# Patient Record
Sex: Female | Born: 1990 | Race: White | Hispanic: No | Marital: Single | Smoking: Former smoker
Health system: Southern US, Community
[De-identification: ages and names within clinical notes are randomized; demographics above are authoritative.]

## PROBLEM LIST (undated history)

## (undated) DIAGNOSIS — F419 Anxiety disorder, unspecified: Secondary | ICD-10-CM

## (undated) DIAGNOSIS — R569 Unspecified convulsions: Secondary | ICD-10-CM

## (undated) HISTORY — PX: NO PAST SURGERIES: SHX2092

## (undated) HISTORY — DX: Unspecified convulsions: R56.9

## (undated) HISTORY — DX: Anxiety disorder, unspecified: F41.9

---

## 2011-03-30 ENCOUNTER — Emergency Department: Payer: Self-pay | Admitting: Internal Medicine

## 2011-03-30 LAB — COMPREHENSIVE METABOLIC PANEL
Anion Gap: 9 (ref 7–16)
BUN: 10 mg/dL (ref 7–18)
Calcium, Total: 8.9 mg/dL (ref 8.5–10.1)
Chloride: 105 mmol/L (ref 98–107)
Co2: 28 mmol/L (ref 21–32)
Creatinine: 0.66 mg/dL (ref 0.60–1.30)
EGFR (African American): >60
EGFR (Non-African Amer.): >60
Potassium: 3.9 mmol/L (ref 3.5–5.1)
SGOT(AST): 88 U/L — ABNORMAL HIGH (ref 15–37)
SGPT (ALT): 32 U/L
Total Protein: 7.4 g/dL (ref 6.4–8.2)

## 2011-03-30 LAB — DRUG SCREEN, URINE
Amphetamines, Ur Screen: NEGATIVE (ref ?–1000)
Cannabinoid 50 Ng, Ur ~~LOC~~: POSITIVE (ref ?–50)
MDMA (Ecstasy)Ur Screen: NEGATIVE (ref ?–500)
Methadone, Ur Screen: NEGATIVE (ref ?–300)
Opiate, Ur Screen: NEGATIVE (ref ?–300)
Phencyclidine (PCP) Ur S: NEGATIVE (ref ?–25)

## 2011-03-30 LAB — CBC
HCT: 37.8 % (ref 35.0–47.0)
RBC: 3.87 10*6/uL (ref 3.80–5.20)
RDW: 13.1 % (ref 11.5–14.5)
WBC: 15.2 10*3/uL — ABNORMAL HIGH (ref 3.6–11.0)

## 2011-03-30 LAB — PREGNANCY, URINE: Pregnancy Test, Urine: NEGATIVE m[IU]/mL

## 2011-05-19 ENCOUNTER — Emergency Department: Payer: Self-pay | Admitting: Emergency Medicine

## 2011-05-19 LAB — URINALYSIS, COMPLETE
Blood: NEGATIVE
Ketone: NEGATIVE
Leukocyte Esterase: NEGATIVE
Ph: 7 (ref 4.5–8.0)
Protein: NEGATIVE
RBC,UR: <1 /HPF (ref 0–5)
Squamous Epithelial: 1
WBC UR: 2 /HPF (ref 0–5)

## 2011-05-19 LAB — COMPREHENSIVE METABOLIC PANEL
Albumin: 3.9 g/dL (ref 3.4–5.0)
BUN: 11 mg/dL (ref 7–18)
Bilirubin,Total: 0.4 mg/dL (ref 0.2–1.0)
Calcium, Total: 8.7 mg/dL (ref 8.5–10.1)
Creatinine: 0.77 mg/dL (ref 0.60–1.30)
Glucose: 91 mg/dL (ref 65–99)
Potassium: 3.8 mmol/L (ref 3.5–5.1)
SGOT(AST): 66 U/L — ABNORMAL HIGH (ref 15–37)
SGPT (ALT): 26 U/L
Total Protein: 7.1 g/dL (ref 6.4–8.2)

## 2011-05-19 LAB — CBC
HGB: 12.4 g/dL (ref 12.0–16.0)
MCH: 33.5 pg (ref 26.0–34.0)
MCV: 98 fL (ref 80–100)
Platelet: 259 10*3/uL (ref 150–440)
RBC: 3.72 10*6/uL — ABNORMAL LOW (ref 3.80–5.20)
WBC: 10.2 10*3/uL (ref 3.6–11.0)

## 2012-07-06 ENCOUNTER — Telehealth: Payer: Self-pay

## 2012-07-06 NOTE — Telephone Encounter (Signed)
Called to sched appt as requested. No answer.

## 2012-12-19 ENCOUNTER — Ambulatory Visit (INDEPENDENT_AMBULATORY_CARE_PROVIDER_SITE_OTHER): Payer: 59 | Admitting: Diagnostic Neuroimaging

## 2012-12-19 ENCOUNTER — Encounter: Payer: Self-pay | Admitting: Diagnostic Neuroimaging

## 2012-12-19 VITALS — BP 111/60 | HR 66 | Ht 67.5 in | Wt 133.0 lb

## 2012-12-19 DIAGNOSIS — G40909 Epilepsy, unspecified, not intractable, without status epilepticus: Secondary | ICD-10-CM | POA: Insufficient documentation

## 2012-12-19 DIAGNOSIS — G40109 Localization-related (focal) (partial) symptomatic epilepsy and epileptic syndromes with simple partial seizures, not intractable, without status epilepticus: Secondary | ICD-10-CM

## 2012-12-19 MED ORDER — LEVETIRACETAM 500 MG PO TABS: 500.0000 mg | ORAL_TABLET | Freq: Two times a day (BID) | ORAL | Status: DC
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
## 2012-12-19 NOTE — Patient Instructions (Signed)
Continue current medication.
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
## 2013-10-17 ENCOUNTER — Telehealth: Payer: Self-pay | Admitting: Diagnostic Neuroimaging

## 2013-10-17 NOTE — Telephone Encounter (Signed)
Return call to patient, number listed is no longer in service, contact number busy, will try again later for more information on seizure

## 2013-10-17 NOTE — Telephone Encounter (Signed)
Patient had seizure on Tuesday and would like a sooner appointment with Dr. Marjory LiesPenumalli.  Please call and advise.

## 2013-11-06 ENCOUNTER — Telehealth: Payer: Self-pay | Admitting: Diagnostic Neuroimaging

## 2013-11-06 NOTE — Telephone Encounter (Signed)
Patient calling to state that she had a seizure a few weeks ago and wants to get a sooner appointment to see Dr. Marjory LiesPenumalli than the one already scheduled. Please call and advise.

## 2013-11-10 NOTE — Telephone Encounter (Signed)
Spoke to patient. Had recent seizure. Requesting earlier appt. Scheduled appt for tomorrwo 11/11/13 with NP. Patient agreed.

## 2013-11-11 ENCOUNTER — Ambulatory Visit: Payer: Self-pay | Admitting: Nurse Practitioner

## 2013-11-11 ENCOUNTER — Telehealth: Payer: Self-pay | Admitting: Nurse Practitioner

## 2013-11-11 NOTE — Telephone Encounter (Signed)
Patient was no show for today's office appointment.  

## 2013-11-14 ENCOUNTER — Ambulatory Visit: Payer: 59 | Admitting: Nurse Practitioner

## 2013-11-18 ENCOUNTER — Telehealth: Payer: Self-pay | Admitting: Nurse Practitioner

## 2013-11-18 ENCOUNTER — Ambulatory Visit: Payer: Self-pay | Admitting: Nurse Practitioner

## 2013-11-18 NOTE — Telephone Encounter (Signed)
Patient was no show for today's office appointment.  

## 2013-12-12 ENCOUNTER — Ambulatory Visit (INDEPENDENT_AMBULATORY_CARE_PROVIDER_SITE_OTHER): Payer: 59 | Admitting: Nurse Practitioner

## 2013-12-12 ENCOUNTER — Encounter (INDEPENDENT_AMBULATORY_CARE_PROVIDER_SITE_OTHER): Payer: Self-pay

## 2013-12-12 ENCOUNTER — Encounter: Payer: Self-pay | Admitting: Nurse Practitioner

## 2013-12-12 VITALS — BP 115/64 | HR 67 | Ht 65.0 in | Wt 125.0 lb

## 2013-12-12 DIAGNOSIS — F419 Anxiety disorder, unspecified: Secondary | ICD-10-CM | POA: Insufficient documentation

## 2013-12-12 DIAGNOSIS — G40909 Epilepsy, unspecified, not intractable, without status epilepticus: Secondary | ICD-10-CM

## 2013-12-12 DIAGNOSIS — G40109 Localization-related (focal) (partial) symptomatic epilepsy and epileptic syndromes with simple partial seizures, not intractable, without status epilepticus: Secondary | ICD-10-CM

## 2013-12-12 MED ORDER — LEVETIRACETAM 500 MG PO TABS: 500.0000 mg | ORAL_TABLET | Freq: Two times a day (BID) | ORAL | Status: DC
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
## 2013-12-12 NOTE — Patient Instructions (Signed)
Continue Levetiracetam  twice daily.    Seizure First Aid: In most circumstances, seizures typically last less than 2-3 minutes, and do not require any intervention, besides keeping the patient safe from injury. Nothing should be placed in the patient's mouth. During the seizure, the patient should be rolled onto their side in hopes of preventing aspiration should vomiting occur. Do not place anything in the patient's mouth. Should the seizure persist greater than 5 minutes, intervention will likely be needed to get the seizure to stop. At the 5 minute mark, EMS should be called. Also, consider calling EMS for multiple seizures, injuries or if the patient is not improving towards baseline after a seizure.  Discussed state regulations regarding driving restrictions and the need to be free of seizures (that would impair the ability to operate a vehicle safely) for at least six months before returning to driving. The patient is aware that it is their responsibility to notify the [facility] and to not drive until approved.  .Managing Seizure Triggers: Tips for Lifestyle Modification Adapted from the [facility], [facility], [place], [place] and the journal "Clinical Nursing Practice in Epilepsy", Spring 1994.  Developing plans to modify your lifestyle is an important part of seizure preparedness. It's a way that you, as a person with seizures or a parent of a child with seizures, can take charge and play an active role in your epilepsy care. The following tips are examples of what people can do to manage triggers. Some of these tips may require a change in behavior, others may be ways to adjust your environment or schedule so not everything happens at once. Before choosing tips to try, make sure you've assessed your situation and talked to your doctor and other health care professionals for their suggestions too. Please note that research on the effectiveness  of many of these techniques is limited. Many of these tips are common sense suggestions or are from health care professionals and people with epilepsy as to what they have seen and tried.  Noises: People who think they are affected by noises should be sure to talk to their doctor about whether they have a form of 'reflex epilepsy' or if general noise or distraction may be a trigger in another way. People with true reflex epilepsy may respond to specific seizure medicines and should talk to their doctor. Try using earplugs or earphones, especially in noisy or crowded places. Try listening to relaxing music or sounds, or try distracting yourself by singing or focusing on another activity.  Bright, flashing or fluorescent lights: Use polarized or tinted glasses. Use natural lighting when indoors. Focus on distant objects when riding in a car to avoid flickering lights or patterns. Avoid discos, strobe lights or flashing bulbs on holiday decorations. Use computer monitor with minimal contrast glare or use a screen filter. Consult with your doctor about other specific recommendations for computer use.  Sleep: Try to regulate sleeping habits so you have a consistent schedule and get enough sleep. Keep a log or diary of your sleep patterns, seizures and general well-being. Ask a partner or companion to record his or her observations too. Consider the following ideas to improve sleep.  . Discuss your medicine schedule with your doctor or nurse. Changing times or doses at night may help sleep. . Limit caffeine and try to avoid it after noon time or mid?afternoon at the latest. . Avoid alcohol and nicotine prior to sleep. . Limit working or studying late at  night. Stop work at least one hour before bedtime to allow time to relax. . Exercise in the early evening if possible. . Take warm showers or have someone give you a back rub before bedtime to decrease muscle tension. . Try relaxation  exercises before bedtime. . Limit naps and don't nap in the early evening. . If anxious or worried, talk to someone or write down your feelings before going to sleep. Put this away and deal with these worries or concerns in the morning! . If you can't fall asleep within 15 minutes get up and do something else for 15 minutes. Then go back to bed and try again. Don't toss and turn in bed all night.  Exercise: Regular exercise is good for everyone. Pace your exercise to avoid getting too tired or hyperventilation. Avoid exercising in the middle of the day during hot weather. Ask your doctor about any specific exercises you may need to avoid.  Hyperventilation: Try relaxation or slow breathing exercises when anxious or if you begin to hyperventilate. Pace your activity and avoid sports that may trigger hyperventilation.  Diet: Regulate meal times and patterns around sleep, activity, and medication schedules. Usually taking medicines after food or around meals makes it easier to remember them and may lessen any stomach distress from side effects of medicines. Have a well-balanced diet and eat at consistent times to avoid long periods without food. If your appetite is poor, try small frequent meals instead of skipping meals. Avoid foods and drinks that may aggravate seizures. Not everyone is sensitive to foods, but if you are, talk to your doctor about how to modify your diet. If you are following a diet specifically for your epilepsy, be sure to follow the advice of your doctor and nutritionist.  Alcohol/Drugs: Avoid recreational drugs and talk to your doctor about use of alcohol. Avoid alcohol completely if you're going through high-risk times or have recently had surgery. If you choose to drink alcohol, use 'moderation', drink slowly, and have only one or two glasses at a time. Consider carefully what you drink, avoiding 'hard liquor' or mixed drinks that may have high alcohol content. If alcohol  and drugs are a problem for you, talk to your doctor and get professional help.  Hormonal changes: Both men and women may notice a cyclical pattern to their seizures. Record seizures on a calendar and track them in relation to any changes in hormones. Women who are having menstrual cycles should track their cycle days. Women who have stopped having their menses should track other symptoms or changes, while women who are pregnant should track their pregnancy too. The use of hormonal medicines, such as contraceptives or birth control pills as well as hormonal replacement therapy, may affect seizures in some women, so record the dates and doses of these medicines.  NOTE: some seizure medicines may interfere with the effectiveness of hormonal contraceptives making unexpected or unplanned pregnancy more likely. Be sure to talk to your doctor about all contraceptive use.When seizures cluster around menses or hormone changes, women should try to modify their lifestyle so other triggers don't occur during this high-risk time. Some women may use 'as needed' medicines to help treat seizures associated with menses. Note the use of these on your calendars and seizure preparedness plan.  Illness, fever, trauma: Notify your doctor if you become ill, have a fever, injure yourself seriously, or need other medicines such as antibiotics, painkillers, or cold medicines. Some people may notice that certain medicines can trigger  seizures or interfere with seizure medicines. Fevers, other illnesses and injuries may also make you more susceptible and you'll need to monitor your seizures carefully. Try to limit other triggers during these times and talk to your doctor about what medicines you can use.  Stress, anxiety, depression: Emotional stress is a common trigger for some people, and stress can be a cause and symptom of mood problems such as anxiety and depression. Track your stress level and mood in relation to your  seizures on your diary. During stressful times, consider ways to modify your lifestyle and manage stress better. . Try counseling to help cope with seizures or other problems. . Consider support groups for epilepsy, or groups for stress management, therapy, and other support. . Write down feelings in a diary on a regular basis. It helps you get feelings out, rather than hold them in, and can help you see the issues more clearly. . Use 'time-out' periods. Just like kids may need a time-out when they are overwhelmed or acting out, so too do adults. Giving yourself a time-out allows you to take a step back from the stressor or situation and think about how best to address it. . Learn relaxation exercises, deep breathing, yoga, or other strategies that help with stress and general well-being. . Tell your doctor and nurse how you feel. The effects of stress can be harmful to your seizures, and your life. When mood changes last longer than expected, you may need help from a mental health professional too. If you feel emotionally unsafe, call your doctor or go to an emergency room to be evaluated.

## 2013-12-18 NOTE — Progress Notes (Signed)
I reviewed note and agree with plan.   Ankush Gintz R. Ruthella Kirchman, MD  Certified in Neurology, Neurophysiology and Neuroimaging  [facility] [address], [address] [place], [place] [address] [phone]   

## 2013-12-19 ENCOUNTER — Ambulatory Visit: Payer: 59 | Admitting: Diagnostic Neuroimaging

## 2014-08-04 LAB — HM PAP SMEAR: HM Pap smear: NEGATIVE

## 2014-12-14 ENCOUNTER — Ambulatory Visit: Payer: 59 | Admitting: Diagnostic Neuroimaging

## 2014-12-15 ENCOUNTER — Encounter: Payer: Self-pay | Admitting: Diagnostic Neuroimaging

## 2014-12-21 ENCOUNTER — Telehealth: Payer: Self-pay | Admitting: Diagnostic Neuroimaging

## 2014-12-21 DIAGNOSIS — G40109 Localization-related (focal) (partial) symptomatic epilepsy and epileptic syndromes with simple partial seizures, not intractable, without status epilepticus: Secondary | ICD-10-CM

## 2014-12-21 MED ORDER — LEVETIRACETAM 500 MG PO TABS: 500.0000 mg | ORAL_TABLET | Freq: Two times a day (BID) | ORAL | Status: DC

## 2014-12-21 NOTE — Telephone Encounter (Signed)
Patient phone call [phone] (last seen 12/19/12, no show 12/14/14, pt cx 12/19/13) requesting refill on levETIRAcetam (KEPPRA) 500 MG tablet. Pt states she had called one time before and nurse was able to get her a refill on this medication.

## 2014-12-21 NOTE — Telephone Encounter (Signed)
Called patient and informed her that Dr Marjory Lies approved 1 month refill, and it has been sent to her pharmacy. Informed her she must come in for FU for further refills. She states she did not come for her FU earlier this month because she no longer has insurance. She states she left vm for Angie Atkins, billing to call her and let her know what her cost will be for a FU. Informed her that Karoline Caldwell is out today but returns tomorrow and will call her back. She verbalized understanding and stated she would call back after speaking with Angie to schedule a FU.

## 2014-12-21 NOTE — Telephone Encounter (Signed)
Refill 1 month and setup revisit with me. -VRP

## 2014-12-24 NOTE — Telephone Encounter (Signed)
Spoke with patient who spoke with Angie and Gaynell in billing. She will now reschedule a FU to get medication refill order. Scheduled her for 01/19/15, requested she arrive 15 min early to check in. She verbalized understanding.

## 2015-01-19 ENCOUNTER — Telehealth: Payer: Self-pay | Admitting: Diagnostic Neuroimaging

## 2015-01-19 ENCOUNTER — Ambulatory Visit: Payer: Self-pay | Admitting: Diagnostic Neuroimaging

## 2015-01-19 DIAGNOSIS — G40109 Localization-related (focal) (partial) symptomatic epilepsy and epileptic syndromes with simple partial seizures, not intractable, without status epilepticus: Secondary | ICD-10-CM

## 2015-01-19 NOTE — Telephone Encounter (Signed)
Per Dr Marjory LiesPenumalli, called pt's pharmacy,  [facility] [place] and spoke with Jesusita Okaan, pharmacist; gave orders for one month refill of Keppra 500 mg tabs, disp #60, 0 refills. Advised him patient is being referred to another neurology practice and may not refill until she establishes care there. He verbalized understanding.

## 2015-01-19 NOTE — Telephone Encounter (Signed)
Spoke with patient who is requesting referral to [facility], closer to her home.  [facility] will prescribe another month of her Keppra; she confirmed pharmacy.  Informed her that Dr Marjory LiesPenumalli will prescribe medication until she is seen by [facility] if her referral apt is not within the next month. She verbalized understanding, appreciation.  Referral placed in Epic for ambulatory neurology, [facility].

## 2015-01-19 NOTE — Telephone Encounter (Signed)
Pt called and is canelling her appt for today. She is requesting a refill on levETIRAcetam (KEPPRA) 500 MG tablet. She does not need it just yet but knows that she will before she can come back for an appt.  Please Note: Pt has had 3 NO SHOWS and today will make her 4th. May call pt [phone].

## 2015-01-19 NOTE — Telephone Encounter (Addendum)
Left VM requesting patient call back to discuss importance of her coming to her apt today. Per previous phone conversation she was informed that Dr Marjory LiesPenumalli needed to see her for FU because she was last seen over one year ago. He only approved a refill x 1 month with stipulation, understanding she see him for FU before more medication would be prescribed. Left this caller's name, number.   1:55 pm  Left second VM requesting call back to discuss medication and FU.

## 2015-01-22 ENCOUNTER — Telehealth: Payer: Self-pay | Admitting: *Deleted

## 2015-01-22 ENCOUNTER — Encounter: Payer: Self-pay | Admitting: *Deleted

## 2015-01-22 NOTE — Telephone Encounter (Signed)
Called patient and informed her that another month's refill of Kepp[facility] has [facility] [place], informed her this RN spoke [facility]end[facility]. She confirmed her appointment with [facility] and verbalized understanding, appreciation of call.

## 2015-03-01 ENCOUNTER — Telehealth: Payer: Self-pay | Admitting: Diagnostic Neuroimaging

## 2015-03-01 DIAGNOSIS — G40109 Localization-related (focal) (partial) symptomatic epilepsy and epileptic syndromes with simple partial seizures, not intractable, without status epilepticus: Secondary | ICD-10-CM

## 2015-03-01 NOTE — Telephone Encounter (Signed)
Patient called to advise she has new patient appointment at [facility] this month, however would like to be referred to [facility], she understands they have a program for people like her that has no insurance. It would be better off for her financial situation to [facility].

## 2015-03-09 NOTE — Telephone Encounter (Signed)
Rn call patient about her referral to [facility]. Pt stated she lives in [place] and works two jobs and is in school. Rn stated the referral was sent by the referral staff at [facility]. The order stated they were book until  April 2017. Rn gave patient number to call to schedule appointment. Pt stated she has seizure medications until end of December 2016. Pt stated she has not had any seizures and is doing fine with the current medication regimen. Rn stated she can be schedule with a NP for refills in the next two weeks. Pt stated she has to give her job a two weeks notice for time off. Pt would like refills until she gets an appt in at [facility]. Pt was last seen 12/2013 by a [facility] provider. Rn stated Dr. Marjory LiesPenumalli will be notified of her wanting refills for a month or 3 months whatever he can decide. Pt will try to get an appt with the next month.

## 2015-03-09 NOTE — Telephone Encounter (Signed)
Patient is calling and states she has not heard anything in regard to an referral/appointment at [facility].  Please advise.  Thanks!

## 2015-03-10 ENCOUNTER — Other Ambulatory Visit: Payer: Self-pay

## 2015-03-10 DIAGNOSIS — G40109 Localization-related (focal) (partial) symptomatic epilepsy and epileptic syndromes with simple partial seizures, not intractable, without status epilepticus: Secondary | ICD-10-CM

## 2015-03-10 MED ORDER — LEVETIRACETAM 500 MG PO TABS: 500.0000 mg | ORAL_TABLET | Freq: Two times a day (BID) | ORAL | Status: AC

## 2015-03-10 NOTE — Telephone Encounter (Signed)
pls refill her meds until her appt with new neurologist in Apr/May 2017

## 2015-03-10 NOTE — Telephone Encounter (Signed)
Rn call patient about her refill for Keppra. Rn stated Dr. Marjory LiesPenumalli stated she can refills till April or May 2017. Refill done for keppra. Pt will be [facility] for an appt for April or May 2017.

## 2015-06-23 NOTE — Telephone Encounter (Signed)
Error

## 2015-07-17 ENCOUNTER — Other Ambulatory Visit: Payer: Self-pay | Admitting: Diagnostic Neuroimaging

## 2015-12-11 ENCOUNTER — Encounter: Payer: Self-pay | Admitting: *Deleted

## 2015-12-11 ENCOUNTER — Emergency Department
Admission: EM | Admit: 2015-12-11 | Discharge: 2015-12-11 | Disposition: A | Discharge status: Home / Self Care | Payer: Self-pay | Attending: Emergency Medicine | Admitting: Emergency Medicine

## 2015-12-11 DIAGNOSIS — R21 Rash and other nonspecific skin eruption: Secondary | ICD-10-CM | POA: Insufficient documentation

## 2015-12-11 DIAGNOSIS — T7840XA Allergy, unspecified, initial encounter: Secondary | ICD-10-CM

## 2015-12-11 MED ORDER — PREDNISONE 20 MG PO TABS
60.0000 mg | ORAL_TABLET | Freq: Once | ORAL | Status: AC
  Administered 2015-12-11: 60 mg via ORAL
  Filled 2015-12-11: qty 3

## 2015-12-11 MED ORDER — PREDNISONE 20 MG PO TABS: 40.0000 mg | ORAL_TABLET | Freq: Every day | ORAL | 0 refills | Status: DC

## 2015-12-11 NOTE — ED Notes (Signed)
Pt discharged to home.  Discharge instructions reviewed.  Verbalized understanding.  No questions or concerns at this time.  Teach back verified.  Pt in NAD.  No items left in ED.   

## 2015-12-11 NOTE — ED Provider Notes (Signed)
Time Seen: Approximately [phone]  I have reviewed the triage notes  Chief Complaint: Allergic Reaction   History of Present Illness: Audrey Perez is a 25 y.o. female who states she was recently treated for kidney stones [facility] where she received some IV Toradol. Patient states she woke up in the morning with a rash that seems to be increasing his first across her torso and then spread to both her back and her arms. She states she had some feelings of throat tightness at home was still able to breathe and swallow normally. She states that part has seemed to improved after she took over-the-counter Benadryl prior to arrival. Any other new exposures and took ibuprofen for pain from her kidney stone but states that she's had ibuprofen the past without difficulty. An ice any new detergents, pets, etc.   Past Medical History:  Diagnosis Date  . Seizures [facility])     Patient Active Problem List   Diagnosis Date Noted  . Anxiety 12/12/2013  . Seizure disorder (HCC) 12/19/2012  . 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 No pertinent surgical history.  History reviewed. No pertinent surgical history.  Current Outpatient Rx  . Order #: [id number] Class: Historical Med  . Order #: [id number] Class: Normal  . Order #: [id number] Class: Historical Med  . Order #: [id number] Class: Print    Allergies:  Review of patient's allergies indicates no known allergies.  Family History: History reviewed. No pertinent family history.  Social History: Social History  Substance Use Topics  . Smoking status: Never Smoker  . Smokeless tobacco: Never Used  . Alcohol use Yes     Comment: occasionally     Review of Systems:   10 point review of systems was performed and was otherwise negative:  Constitutional: No fever Eyes: No visual disturbances ENT: No sore throat, ear pain Cardiac: No chest pain Respiratory: No shortness of breath, wheezing, or stridor Abdomen: No Current  abdominal pain, no vomiting, No diarrhea Endocrine: No weight loss, No night sweats Extremities: No peripheral edema, cyanosis Skin: No rashes, easy bruising Neurologic: No focal weakness, trouble with speech or swollowing Urologic: No dysuria, Hematuria, or urinary frequency   Physical Exam:  ED Triage Vitals  Enc Vitals Group     BP 12/11/15 0542 (!) 108/59     Pulse Rate 12/11/15 0542 74     Resp 12/11/15 0542 18     Temp 12/11/15 0542 98.4 F (36.9 C)     Temp Source 12/11/15 0542 Oral     SpO2 12/11/15 0542 100 %     Weight 12/11/15 0543 125 lb (56.7 kg)     Height 12/11/15 0543 5\' 5"  (1.651 m)     Head Circumference --      Peak Flow --      Pain Score --      Pain Loc --      Pain Edu? --      Excl. in GC? --     General: Awake , Alert , and Oriented times 3; GCS 15 Head: Normal cephalic , atraumatic Eyes: Pupils equal , round, reactive to light Nose/Throat: No nasal drainage, patent upper airway without erythema or exudate.  Neck: Supple, Full range of motion, No anterior adenopathy or palpable thyroid masses. No stridor Lungs: Clear to ascultation without wheezes , rhonchi, or rales Heart: Regular rate, regular rhythm without murmurs , gallops , or rubs Abdomen: Soft, non tender without rebound, guarding , or rigidity; bowel sounds positive  and symmetric in all 4 quadrants. No organomegaly .        Extremities: 2 plus symmetric pulses. No edema, clubbing or cyanosis Neurologic: normal ambulation, Motor symmetric without deficits, sensory intact Skin: Diffuse punctate erythematous raised pruritic rash across both her arms upper back anterior abdominal region appears hive-like.  ED Course:  Patient's stay here was uneventful and I couldn't give her any more Benadryl because of her driving here to the emergency department. She was prescribed and started on prednisone here in emergency department. She was advised that she may try to avoid ibuprofen as this may be the  source of her rash at this point take Tylenol for pain. She states she is currently not having any kidney stone discomfort. No evidence of anaphylactic reaction Clinical Course     Assessment: * Acute allergic reaction   Final Clinical Impression: *  Final diagnoses:  Allergic reaction, initial encounter     Plan: * Outpatient " New Prescriptions   PREDNISONE (DELTASONE) 20 MG TABLET    Take 2 tablets (40 mg total) by mouth daily.  " Patient was advised to return immediately if condition worsens. Patient was advised to follow up with their primary care physician or other specialized physicians involved in their outpatient care. The patient and/or family member/power of attorney had laboratory results reviewed at the bedside. All questions and concerns were addressed and appropriate discharge instructions were distributed by the nursing staff.             Jennye MoccasinBrian S Rodriquez Thorner, MD 12/11/15 [phone]

## 2015-12-11 NOTE — ED Triage Notes (Signed)
Pt states on Thursday evening she was treated for [facility] at [facility]. Pt states she woke with a rash on torso that has spread to back and arms. Pt states the only med she was given that she has nor received before was Toradol IV. Pt c/o throat feels tight and she felt as if it was hard to breathe. Pt took Benadryl OTC 50 mg PO prior to arrival. Pt states decreased itching but her throat still feels tight.

## 2015-12-11 NOTE — Discharge Instructions (Signed)
Please take Benadryl around-the-clock 50 mg every 6 hours. Return emergency Department for persistent chest discomfort, trouble with speech, trouble swallowing, or any other new concerns. Continue investigation at home for new exposures. Please contact her primary physician for possible referral to dermatology as needed  Please return immediately if condition worsens. Please contact her primary physician or the physician you were given for referral. If you have any specialist physicians involved in her treatment and plan please also contact them. Thank you for using [facility].

## 2016-03-02 ENCOUNTER — Ambulatory Visit: Payer: Self-pay | Admitting: Family Medicine

## 2016-03-10 ENCOUNTER — Ambulatory Visit: Payer: Self-pay | Admitting: Family Medicine

## 2016-05-26 ENCOUNTER — Ambulatory Visit
Admission: EM | Admit: 2016-05-26 | Discharge: 2016-05-26 | Disposition: A | Discharge status: Home / Self Care | Payer: Self-pay | Attending: Family Medicine | Admitting: Family Medicine

## 2016-05-26 ENCOUNTER — Encounter: Payer: Self-pay | Admitting: Emergency Medicine

## 2016-05-26 DIAGNOSIS — L600 Ingrowing nail: Secondary | ICD-10-CM

## 2016-05-26 MED ORDER — MUPIROCIN 2 % EX OINT: 1.0000 "application " | TOPICAL_OINTMENT | Freq: Three times a day (TID) | CUTANEOUS | 0 refills | Status: DC

## 2016-05-26 NOTE — ED Triage Notes (Signed)
Patient c/o pain in her right 1st toe off and on for a year. Patient also reports redness and swelling in her toe.

## 2016-05-26 NOTE — ED Provider Notes (Signed)
CSN: [id number]     Arrival date & time 05/26/16  1336 History   First MD Initiated Contact with Patient 05/26/16 1422     Chief Complaint  Patient presents with  . Toe Pain   (Consider location/radiation/quality/duration/timing/severity/associated sxs/prior Treatment) HPI  A 26 year old female who presents with aspect of her right first toe cleaning of the distal nail fold medially is been happening off and on for over a year. He said it does not seem to want to grow out he continually tries to cut back and trim it. She is probably had a toenail at one point it is she has never allowed to grow out. Clipping it very closely. All of her nails are trimmed improperly short and rounded. Currently does not have an infection.       Past Medical History:  Diagnosis Date  . Anxiety   . Seizures (HCC)    History reviewed. No pertinent surgical history. Family History  Problem Relation Age of Onset  . Anxiety disorder Father   . Hypertension Father   . Stroke Paternal Grandmother    Social History  Substance Use Topics  . Smoking status: Former Games developer  . Smokeless tobacco: Never Used  . Alcohol use Yes     Comment: occasionally   OB History    No data available     Review of Systems  Constitutional: Negative for activity change, chills, fatigue and fever.  Skin: Positive for wound.  All other systems reviewed and are negative.   Allergies  Patient has no known allergies.  Home Medications   Prior to Admission medications   Medication Sig Start Date End Date Taking? Authorizing Provider  levETIRAcetam (KEPPRA) 500 MG tablet Take 1 tablet (500 mg total) by mouth 2 (two) times daily. 03/10/15   Suanne Marker, MD  mupirocin ointment (BACTROBAN) 2 % Apply 1 application topically 3 (three) times daily. 05/26/16   Lutricia Feil, PA-C  norgestimate-ethinyl estradiol (ORTHO-CYCLEN,SPRINTEC,PREVIFEM) 0.25-35 MG-MCG tablet Take by mouth.    Historical Provider, MD   Meds  Ordered and Administered this Visit  Medications - No data to display  BP 121/69 (BP Location: Left Arm)   Pulse 77   Temp 98.3 F (36.8 C) (Oral)   Resp 16   Ht 5\' 6"  (1.676 m)   Wt 125 lb (56.7 kg)   LMP 05/05/2016 (Approximate)   SpO2 100%   BMI 20.18 kg/m  No data found.   Physical Exam  Constitutional: She is oriented to person, place, and time. She appears well-developed and well-nourished. No distress.  HENT:  Head: Normocephalic.  Eyes: Pupils are equal, round, and reactive to light.  Neck: Normal range of motion.  Musculoskeletal: Normal range of motion. She exhibits tenderness.  Examination of her right great toe shows no active infection present. Not tender in the area. The distal nail fold shows the patient to have trended into the remainder of the nail is trimmed very shortly and curved on the instructions well is trimmed all the way back to the nailbed. Is no drainage. Is no erythema. Is no swelling.  Neurological: She is alert and oriented to person, place, and time.  Skin: Skin is warm and dry. She is not diaphoretic.  Psychiatric: She has a normal mood and affect. Her behavior is normal. Judgment and thought content normal.  Nursing note and vitals reviewed.   Urgent Care Course     Procedures (including critical care time)  Labs Review Labs Reviewed - No  data to display  Imaging Review No results found.   Visual Acuity Review  Right Eye Distance:   Left Eye Distance:   Bilateral Distance:    Right Eye Near:   Left Eye Near:    Bilateral Near:         MDM   1. Ingrown right greater toenail    I had a long discussion with the patient about proper nail trimming. At this point it appears she is being too aggressive with her nail trimming and the trimming of the the medial portion of her toenail down to the nailbed. Nail fold is open on the distal end from her trimming. Luckily she has no infection present. Is apparent that she has had  infections in the past. Advised her to allow her nail to grow out. She can use a cotton wad under the nail or use  dental floss to elevate the edge of the nail to allow it to  grow out properly. If She is unable to do this then referral to podiatrist may be her best option.      Lutricia FeilWilliam P Carlisle Enke, PA-C 05/26/16 2214

## 2017-07-10 LAB — HM HIV SCREENING LAB: HM HIV Screening: NEGATIVE

## 2019-07-15 ENCOUNTER — Ambulatory Visit: Payer: Self-pay

## 2019-07-29 ENCOUNTER — Ambulatory Visit: Payer: Self-pay

## 2020-04-10 ENCOUNTER — Other Ambulatory Visit: Payer: Self-pay

## 2020-04-14 ENCOUNTER — Other Ambulatory Visit: Payer: Self-pay

## 2020-04-14 DIAGNOSIS — Z20822 Contact with and (suspected) exposure to covid-19: Secondary | ICD-10-CM

## 2020-04-16 LAB — NOVEL CORONAVIRUS, NAA: SARS-CoV-2, NAA: DETECTED — AB

## 2020-04-16 LAB — SARS-COV-2, NAA 2 DAY TAT

## 2020-10-08 ENCOUNTER — Ambulatory Visit: Payer: Self-pay

## 2021-01-12 ENCOUNTER — Encounter: Payer: Self-pay | Admitting: Advanced Practice Midwife

## 2021-01-12 ENCOUNTER — Ambulatory Visit: Payer: Self-pay

## 2021-01-12 ENCOUNTER — Ambulatory Visit (LOCAL_COMMUNITY_HEALTH_CENTER): Payer: BC Managed Care – PPO | Admitting: Advanced Practice Midwife

## 2021-01-12 ENCOUNTER — Other Ambulatory Visit: Payer: Self-pay

## 2021-01-12 VITALS — BP 100/54 | Temp 98.4°F | Resp 16 | Ht 65.0 in | Wt 136.0 lb

## 2021-01-12 DIAGNOSIS — F1290 Cannabis use, unspecified, uncomplicated: Secondary | ICD-10-CM | POA: Insufficient documentation

## 2021-01-12 DIAGNOSIS — Z01419 Encounter for gynecological examination (general) (routine) without abnormal findings: Secondary | ICD-10-CM

## 2021-01-12 DIAGNOSIS — G40909 Epilepsy, unspecified, not intractable, without status epilepticus: Secondary | ICD-10-CM

## 2021-01-12 DIAGNOSIS — Z3009 Encounter for other general counseling and advice on contraception: Secondary | ICD-10-CM

## 2021-01-12 LAB — WET PREP FOR TRICH, YEAST, CLUE
Trichomonas Exam: NEGATIVE
Yeast Exam: NEGATIVE

## 2021-01-12 LAB — HM HEPATITIS C SCREENING LAB: HM Hepatitis Screen: NEGATIVE

## 2021-01-12 LAB — HM HIV SCREENING LAB: HM HIV Screening: NEGATIVE

## 2021-01-12 NOTE — Progress Notes (Signed)
Wet mount reviewed during clinic visit - no treatment indicated.  ? ?Noni Stonesifer, RN ? ?

## 2021-01-12 NOTE — Progress Notes (Addendum)
[facility] DEPARTMENT [facility] [address] Main Number: [phone]    Family Planning Visit- Initial Visit  Subjective:  Audrey Perez is a 30 y.o.  SWF exsmoker nullip  being seen today for an initial annual visit and to discuss contraceptive options.  The patient is currently using None for pregnancy prevention. Patient reports she does not want a pregnancy in the next year.  Patient has the following medical conditions has Seizure disorder (HCC); Localization-related epilepsy (HCC); and Anxiety dx'd 2013 on their problem list.  Chief Complaint  Patient presents with   Annual Exam    Patient reports here for physical and pap. Last pap 08/04/14 neg, 11/05/17 neg. LMP 12/28/20. Last sex 01/07/21 without condom; with current partner x 1 year; 2 sex partners in last 3 mo. Last dental exam 6 mo ago. Last cig 2020. Last vaped 2020. Last cigar age 65. Last MJ yesterday. Last ETOH 01/09/21 (2-4 beers) q weekend. Employed 40 hrs/wk and plans to start school at [employer] 03/2021. Living alone.  Anxiety dx'd 2013 with Hydroxizine prn.  Patient denies current cigs, vaping, or cigars  Body mass index is 22.63 kg/m. - Patient is eligible for diabetes screening based on BMI and age >20?  not applicable HA1C ordered? not applicable  Patient reports 2  partner/s in last year. Desires STI screening?  Yes  Has patient been screened once for HCV in the past?  No  No results found for: HCVAB  Does the patient have current drug use (including MJ), have a partner with drug use, and/or has been incarcerated since last result? Yes  If yes-- Screen for HCV through [facility]   Does the patient meet criteria for HBV testing? No  Criteria:  -Household, sexual or needle sharing contact with HBV -History of drug use -HIV positive -Those with known Hep C   Health Maintenance Due  Topic Date Due   Hepatitis C Screening  Never done   PAP SMEAR-Modifier  08/03/2017    TETANUS/TDAP  01/14/2019   COVID-19 Vaccine (3 - Booster for Pfizer series) 12/03/2019   INFLUENZA VACCINE  Never done    Review of Systems  Genitourinary:  Positive for frequency (x "a long time"; referred to primary care MD).  Neurological:  Positive for headaches (daily last week; 1x this week over right eye relieved with IB, -N&V,-audio,-vision).  All other systems reviewed and are negative.  The following portions of the patient's history were reviewed and updated as appropriate: allergies, current medications, past family history, past medical history, past social history, past surgical history and problem list. Problem list updated.   See flowsheet for other program required questions.  Objective:   Vitals:   01/12/21 0837  BP: (!) 100/54  Resp: 16  Temp: 98.4 F (36.9 C)  TempSrc: Oral  Weight: 136 lb (61.7 kg)  Height: 5\' 5"  (1.651 m)    Physical Exam Constitutional:      Appearance: Normal appearance. She is normal weight.  HENT:     Head: Normocephalic and atraumatic.     Mouth/Throat:     Mouth: Mucous membranes are moist.     Comments: Last dental exam 6 mo ago Eyes:     Conjunctiva/sclera: Conjunctivae normal.  Neck:     Thyroid: No thyroid mass, thyromegaly or thyroid tenderness.  Cardiovascular:     Rate and Rhythm: Normal rate and regular rhythm.  Pulmonary:     Effort: Pulmonary effort is normal.  Breath sounds: Normal breath sounds.  Chest:  Breasts:    Right: Normal.     Left: Normal.  Abdominal:     General: Abdomen is flat.     Palpations: Abdomen is soft.     Comments: Soft without masses or tenderness, good tone  Genitourinary:    General: Normal vulva.     Exam position: Lithotomy position.     Vagina: Vaginal discharge (white thick leukorrhea, ph <4.5) present.     Cervix: Normal.     Uterus: Normal.      Adnexa: Right adnexa normal and left adnexa normal.     Rectum: Normal.     Comments: Pap done Musculoskeletal:         General: Normal range of motion.     Cervical back: Normal range of motion and neck supple.  Skin:    General: Skin is warm and dry.  Neurological:     Mental Status: She is alert.  Psychiatric:        Mood and Affect: Mood normal.      Assessment and Plan:  Audrey Perez is a 30 y.o. female presenting to the [facility] Department for an initial annual wellness/contraceptive visit  Contraception counseling: Reviewed all forms of birth control options in the tiered based approach. available including abstinence; over the counter/barrier methods; hormonal contraceptive medication including pill, patch, ring, injection,contraceptive implant, ECP; hormonal and nonhormonal IUDs; permanent sterilization options including vasectomy and the various tubal sterilization modalities. Risks, benefits, and typical effectiveness rates were reviewed.  Questions were answered.  Written information was also given to the patient to review.  Patient desires nothing, this was prescribed for patient. She will follow up in  1 year for surveillance.  She was told to call with any further questions, or with any concerns about this method of contraception.  Emphasized use of condoms 100% of the time for STI prevention.  Patient was offered ECP. ECP was not accepted by the patient. ECP counseling was not given - see RN documentation  1. Family planning Please give contact info for Kathreen Cosier, LCSW to pt Please give primary care MD list to pt Treat wet mount per standing orders Immunization nurse consult Declines birth control  - WET PREP FOR TRICH, YEAST, CLUE - Pregnancy, urine - Syphilis Serology, [place] [facility] - Chlamydia/Gonorrhea [place] [facility] - HIV/HCV [place] [facility] - IGP, [facility] HPV - Ambulatory referral to Behavioral Health  2. Well woman exam with routine gynecological exam   3. Seizure disorder (HCC)      Return in about 1 year (around 01/12/2022) for yearly physical  exam.  No future appointments.  Alberteen Spindle, CNM

## 2021-01-15 LAB — IGP, APTIMA HPV
HPV Aptima: NEGATIVE
PAP Smear Comment: 0

## 2021-02-10 NOTE — Addendum Note (Signed)
Addended by: Arnetha Courser on: 02/10/2021 11:36 AM   Modules accepted: Orders

## 2021-02-23 ENCOUNTER — Ambulatory Visit: Payer: Self-pay | Admitting: Licensed Clinical Social Worker

## 2021-02-23 ENCOUNTER — Encounter: Payer: Self-pay | Admitting: Licensed Clinical Social Worker

## 2021-02-23 DIAGNOSIS — F411 Generalized anxiety disorder: Secondary | ICD-10-CM

## 2021-02-23 HISTORY — DX: Generalized anxiety disorder: F41.1
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
## 2021-02-28 ENCOUNTER — Ambulatory Visit: Payer: Self-pay | Admitting: Licensed Clinical Social Worker

## 2021-02-28 NOTE — Progress Notes (Unsigned)
Counselor/Therapist Progress Note  Patient ID: Audrey Perez, MRN: [id number],    Date: 02/28/2021  Time Spent: ***   Treatment Type: Psychotherapy  Reported Symptoms: {CHL AMB Reported [phone]}  Mental Status Exam:  Appearance:   {PSY:22683}     Behavior:  {PSY:21022743}  Motor:  {PSY:22302}  Speech/Language:   {PSY:22685}  Affect:  {PSY:22687}  Mood:  {PSY:31886}  Thought process:  {PSY:31888}  Thought content:    {PSY:[id number]}  Sensory/Perceptual disturbances:    {PSY:508-646-1596}  Orientation:  {PSY:30297}  Attention:  {PSY:22877}  Concentration:  {PSY:623-244-4725}  Memory:  {[phone]}  Fund of knowledge:   {PSY:623-244-4725}  Insight:    {PSY:623-244-4725}  Judgment:   {PSY:623-244-4725}  Impulse Control:  {PSY:623-244-4725}   Risk Assessment: Danger to Self:  No Self-injurious Behavior: No Danger to Others: No Duty to Warn:no Physical Aggression / Violence:No  Access to Firearms a concern: No  Gang Involvement:No   Subjective: Patient was engaged and cooperative throughout the session using time effectively to discuss    . Patient was receptive to feedback and intervention from LCSW. Patient voices continued motivation for treatment and understanding of  . Patient is likely to benefit from future treatment because they remain motivated to decrease  and   and reports benefit of regular sessions.       Interventions: Cognitive Behavioral Therapy and Mindfulness Meditation Established psychological safety. Checked in with patient and reviewed previous session, including assessment and goal of treatment. Reviewed CBTs. Explored patient's goal of treatment and worked collaboratively to develop CBT treatment plan. Provided support through active listening, validation of feelings, and highlighted patient's strengths.  Diagnosis:   ICD-10-CM   1. Generalized anxiety disorder  F41.1       Plan: Patient's goal of treatment is to learn more about herself and  to feel normal.    -Continued assessment is needed - trauma symptoms, ADD/ADHD, and mood symptoms   Future Appointments  Date Time Provider Department Center  02/28/2021  4:00 PM Kathreen Cosier, LCSW [facility] None     Kathreen Cosier, [place]

## 2021-03-07 ENCOUNTER — Ambulatory Visit: Payer: Self-pay | Admitting: Licensed Clinical Social Worker

## 2021-03-07 DIAGNOSIS — F411 Generalized anxiety disorder: Secondary | ICD-10-CM

## 2021-03-07 NOTE — Progress Notes (Signed)
Counselor/Therapist Progress Note  Patient ID: Audrey Perez, MRN: [id number],    Date: 03/08/2021  Time Spent: 55 minutes    Treatment Type: Psychotherapy  Reported Symptoms:  Anxiety, anxious thoughts  Mental Status Exam:  Appearance:   Casual and Well Groomed     Behavior:  Appropriate and Sharing  Motor:  Normal  Speech/Language:   Clear and Coherent and Normal Rate  Affect:  Appropriate, Congruent, and Full Range  Mood:  normal  Thought process:  normal  Thought content:    WNL  Sensory/Perceptual disturbances:    WNL  Orientation:  oriented to person, place, time/date, and situation  Attention:  Good  Concentration:  Good  Memory:  WNL  Fund of knowledge:   Good  Insight:    Good  Judgment:   Good  Impulse Control:  Good   Risk Assessment: Danger to Self:  No Self-injurious Behavior: No Danger to Others: No Duty to Warn:no Physical Aggression / Violence:No  Access to Firearms a concern: No  Gang Involvement:No   Subjective: Patient was engaged and cooperative throughout the session using time effectively to discuss continued assessment of symptoms and treatment plan. Patient voices ADD/ADHD symptoms, Dissociative symptoms possibly Depersonalization and/or derealization    Mood instability- vacillating between happy and sad, easily triggered, anxiety and low self-confidence confidence. Patient voices continued motivation for treatment. Patient is likely to benefit from future treatment because she is motivated to decrease symptoms and improve functioning.     Interventions: Cognitive Behavioral Therapy Established psychological safety.  Checked in with patient and reviewed previous session - assessment and goal of treatment. Continued assessment to increase understanding of patient's symptoms and goals of treatment and implemented the Adult ADHD Self-Report Scale (ASRS-v1.1) Symptom Checklist- patient had a positive score for likelihood of ADD/ADHD. Discussed  patient following up with psychiatry for diagnosis clarification. Explored patient's goal of treatment and worked collaboratively to develop treatment plan. Provided support through active listening, validation of feelings, and highlighted patient's strengths.  Diagnosis:   ICD-10-CM   1. Generalized anxiety disorder  F41.1      Plan: Patient's goal of treatment is to learn more about herself and to feel normal.  Treatment Target: Understand the relationship between thoughts, emotions, and behaviors  Psychoeducation on CBT model   Teach the connection between thoughts, emotions, and behaviors  Enhance emotional awareness and discrimination of emotions   Treatment Target: Increase realistic balanced thinking -to learn how to replace thinking with thoughts that are more accurate or helpful Explore patient's thoughts, beliefs, automatic thoughts, assumptions  Identify and replace unhelpful thinking patterns (upsetting ideas, self-talk and mental images) Process distress and allow for emotional release  Questioning and challenging thoughts Cognitive reappraisal  Provided psychoeducation on core beliefs, explore, and assist patient in identifying core beliefs   Treatment Target: Reducing vulnerability to "emotional mind" and increase emotional regulation Values clarification   Self-care - nutrition, sleep, exercise  Mindfulness practices   Patient plans to following up with psychiatry for further evaluation on symptoms of anxiety, PTSD and possible ADHD.   Future Appointments  Date Time Provider Department Center  04/04/2021 10:00 AM Kathreen Cosier, LCSW [facility] None     Kathreen Cosier, [place]

## 2021-04-04 ENCOUNTER — Ambulatory Visit: Payer: Self-pay | Admitting: Licensed Clinical Social Worker

## 2021-04-04 DIAGNOSIS — F411 Generalized anxiety disorder: Secondary | ICD-10-CM

## 2021-04-04 NOTE — Progress Notes (Signed)
Counselor/Therapist Progress Note  Patient ID: Audrey Perez, MRN: [id number],    Date: 04/04/2021  Time Spent: 47 minutes    Treatment Type: Psychotherapy  Reported Symptoms:  2 panic attacks, anxiety, anxious thoughts (overall decrease in anxiety); occasional low mood and sadness  stopped using marijuana   Mental Status Exam:  Appearance:   Casual     Behavior:  Appropriate and Sharing  Motor:  Normal  Speech/Language:   Clear and Coherent and Normal Rate  Affect:  Appropriate, Congruent, and Full Range  Mood:  normal  Thought process:  normal  Thought content:    WNL  Sensory/Perceptual disturbances:    WNL  Orientation:  oriented to person, place, time/date, and situation  Attention:  Good  Concentration:  Good  Memory:  WNL  Fund of knowledge:   Good  Insight:    Good  Judgment:   Good  Impulse Control:  Good   Risk Assessment: Danger to Self:  No Self-injurious Behavior: No Danger to Others: No Duty to Warn:no Physical Aggression / Violence:No  Access to Firearms a concern: No  Gang Involvement:No   Subjective: Patient was engaged and cooperative throughout the session using time effectively to discuss thoughts and feelings. Patient was receptive to feedback and intervention from LCSW. Patient is likely to benefit from future treatment because they remain motivated to decrease anxiety.   Interventions: Cognitive Behavioral Therapy Established psychological safety. Checked in with patient regarding symptoms and current psychosocial stressors. Praised patient for discontinuing use of marijuana. Provided psychoeducation on Sleep hygiene. LCSW assisted patient in processing their emotions about what they have experienced in previous relationship and with new romantic dating partner. LCSW reviewed core beliefs, noticing thoughts and emotions and reflecting/observing automatic thoughts. Encouraged patient to engage in planned activities including working out and being more  organized by using a schedule. Provided support through active listening, validation of feelings, and highlighted patient's strengths.   Diagnosis:   ICD-10-CM   1. Generalized anxiety disorder  F41.1      Plan: Patient's goal of treatment is to learn more about herself and to feel normal.  Treatment Target: Understand the relationship between thoughts, emotions, and behaviors  Psychoeducation on CBT model   Teach the connection between thoughts, emotions, and behaviors  Enhance emotional awareness and discrimination of emotions    Treatment Target: Increase realistic balanced thinking -to learn how to replace thinking with thoughts that are more accurate or helpful Explore patient's thoughts, beliefs, automatic thoughts, assumptions  Identify and replace unhelpful thinking patterns (upsetting ideas, self-talk and mental images) Process distress and allow for emotional release  Questioning and challenging thoughts Cognitive reappraisal  Provided psychoeducation on core beliefs, explore, and assist patient in identifying core beliefs    Treatment Target: Reducing vulnerability to emotional mind and increase emotional regulation Values clarification   Self-care - nutrition, sleep, exercise  Mindfulness practices    Patient plans to following up with psychiatry for further evaluation on symptoms of anxiety, PTSD and possible ADHD.   Future Appointments  Date Time Provider Department Center  05/02/2021 10:30 AM Kathreen Cosier, LCSW [facility] None     Kathreen Cosier, [place]

## 2021-05-02 ENCOUNTER — Ambulatory Visit: Payer: Self-pay | Admitting: Licensed Clinical Social Worker

## 2021-05-16 ENCOUNTER — Ambulatory Visit: Payer: Self-pay | Admitting: Licensed Clinical Social Worker

## 2021-05-16 NOTE — Progress Notes (Unsigned)
Counselor/Therapist Progress Note  Patient ID: Audrey Perez, MRN: [id number],    Date: 05/16/2021  Time Spent: ***   Treatment Type: Psychotherapy  Reported Symptoms: {CHL AMB Reported Symptoms:[phone]}  Mental Status Exam:  Appearance:   {PSY:22683}     Behavior:  {PSY:21022743}  Motor:  {PSY:22302}  Speech/Language:   {PSY:22685}  Affect:  {PSY:22687}  Mood:  {PSY:31886}  Thought process:  {PSY:31888}  Thought content:    {PSY:575-124-0220}  Sensory/Perceptual disturbances:    {PSY:419-460-8698}  Orientation:  {PSY:30297}  Attention:  {PSY:22877}  Concentration:  {PSY:203-018-7153}  Memory:  {PSY:726-377-9973}  Fund of knowledge:   {PSY:203-018-7153}  Insight:    {PSY:203-018-7153}  Judgment:   {PSY:203-018-7153}  Impulse Control:  {PSY:203-018-7153}   Risk Assessment:2 Danger to Self:  No Self-injurious Behavior: No Danger to Others: No Duty to Warn:no Physical Aggression / Violence:No  Access to Firearms a concern: No  Gang Involvement:No   Subjective: Patient was receptive to feedback and intervention from LCSW. Patient was engaged and cooperative throughout the session using time effectively to discuss  thoughts, feelings, and  Patient is likely to benefit from future treatment because they remain motivated to decrease  and   and reports benefit of regular sessions.      Interventions: Cognitive Behavioral Therapy Established psychological safety. Checked in with patient regarding current symptoms and psychosocial stressors, conducting brief assessment. . her week. Reviewed previous session regarding  Therapist assisted, actively listened, taught, shared, role/played, provided   Diagnosis:   ICD-10-CM   1. Generalized anxiety disorder  F41.1       Plan: Patient's goal of treatment is to learn more about herself and to feel normal.  Treatment Target: Understand the relationship between thoughts, emotions, and behaviors  Psychoeducation on CBT model   Teach the  connection between thoughts, emotions, and behaviors  Enhance emotional awareness and discrimination of emotions    Treatment Target: Increase realistic balanced thinking -to learn how to replace thinking with thoughts that are more accurate or helpful Explore patient's thoughts, beliefs, automatic thoughts, assumptions  Identify and replace unhelpful thinking patterns (upsetting ideas, self-talk and mental images) Process distress and allow for emotional release  Questioning and challenging thoughts Cognitive reappraisal  Provided psychoeducation on core beliefs, explore, and assist patient in identifying core beliefs    Treatment Target: Reducing vulnerability to emotional mind and increase emotional regulation Values clarification   Self-care - nutrition, sleep, exercise  Mindfulness practices   Future Appointments  Date Time Provider Department Center  05/16/2021  1:00 PM Kathreen Cosier, LCSW [facility] None    Kathreen Cosier, LCSW

## 2021-05-23 ENCOUNTER — Ambulatory Visit: Payer: Self-pay | Admitting: Licensed Clinical Social Worker

## 2021-05-23 DIAGNOSIS — F411 Generalized anxiety disorder: Secondary | ICD-10-CM

## 2021-05-23 NOTE — Progress Notes (Signed)
Counselor/Therapist Progress Note  Patient ID: CANDEE HOON, MRN: [id number],    Date: 05/23/2021  Time Spent: 47 minutes    Treatment Type: Psychotherapy  Reported Symptoms:  Anxiety, anxious thoughts  Mental Status Exam:  Appearance:   Well Groomed     Behavior:  Appropriate and Sharing  Motor:  Normal  Speech/Language:   Clear and Coherent and Normal Rate  Affect:  Appropriate, Congruent, and Full Range  Mood:  normal  Thought process:  normal  Thought content:    WNL  Sensory/Perceptual disturbances:    WNL  Orientation:  oriented to person, place, time/date, and situation  Attention:  Good  Concentration:  Good  Memory:  WNL  Fund of knowledge:   Good  Insight:    Good  Judgment:   Good  Impulse Control:  Good   Risk Assessment: Danger to Self:  No Self-injurious Behavior: No Danger to Others: No Duty to Warn:no Physical Aggression / Violence:No  Access to Firearms a concern: No  Gang Involvement:No   Subjective: Patient was receptive to feedback and intervention from LCSW and actively and effectively participated throughout the session. Patient is likely to benefit from future treatment because she remains motivated to decrease  anxiety and improve functioning and reports benefit of regular sessions in addressing these symptoms.    Interventions: Cognitive Behavioral Therapy, Mindfulness Meditation, and Motivational Interviewing Checked in with patient regarding their week. LCSW assisted patient in processing their thoughts and emotions about what they are experiences with multiple stressors and with issues related to ex-boyfriend. LCSW reviewed mindfulness emotional regulation strategies, and assisted patient in identifying values related to communication, and encouraged her to write out list of tasks vs rehearsing it in her mind. LCSW and patient also discussed relapse of marijuana use. Provided support through active listening, validation of feelings, and  highlighted patient's strengths.   Diagnosis:   ICD-10-CM   1. Generalized anxiety disorder  F41.1      Plan: Patient's goal of treatment is to learn more about herself and to feel normal.  Treatment Target: Understand the relationship between thoughts, emotions, and behaviors  Psychoeducation on CBT model   Teach the connection between thoughts, emotions, and behaviors  Enhance emotional awareness and discrimination of emotions    Treatment Target: Increase realistic balanced thinking -to learn how to replace thinking with thoughts that are more accurate or helpful Explore patient's thoughts, beliefs, automatic thoughts, assumptions  Identify and replace unhelpful thinking patterns (upsetting ideas, self-talk and mental images) Process distress and allow for emotional release  Questioning and challenging thoughts Cognitive reappraisal  Provided psychoeducation on core beliefs, explore, and assist patient in identifying core beliefs    Treatment Target: Reducing vulnerability to emotional mind and increase emotional regulation Values clarification   Self-care - nutrition, sleep, exercise  Mindfulness practices  Mindfulness acceptance    Future Appointments  Date Time Provider Department Center  06/20/2021 10:00 AM Kathreen Cosier, LCSW [facility] None     Kathreen Cosier, LCSW

## 2021-06-20 ENCOUNTER — Ambulatory Visit: Payer: Self-pay | Admitting: Licensed Clinical Social Worker

## 2021-06-20 NOTE — Progress Notes (Unsigned)
Counselor/Therapist Progress Note ? ?Patient ID: CHARLYNNE TUBBS, MRN: [id number],   ? ?Date: 06/20/2021 ? ?Time Spent: ***  ? ?Treatment Type: {CHL AMB THERAPY TYPES:212-412-6215} ? ?Reported Symptoms: {CHL AMB Reported Symptoms:([phone]} ? ?Mental Status Exam: ? ?Appearance:   {PSY:22683}     ?Behavior:  {PSY:21022743}  ?Motor:  {PSY:22302}  ?Speech/Language:   {PSY:22685}  ?Affect:  {PSY:22687}  ?Mood:  {PSY:31886}  ?Thought process:  {PSY:31888}  ?Thought content:    {PSY:703-731-6431}  ?Sensory/Perceptual disturbances:    {[phone]}  ?Orientation:  {PSY:30297}  ?Attention:  {PSY:22877}  ?Concentration:  {PSY:928-161-1683}  ?Memory:  {PSY:601-326-8538}  ?Fund of knowledge:   {PSY:928-161-1683}  ?Insight:    {PSY:928-161-1683}  ?Judgment:   {PSY:928-161-1683}  ?Impulse Control:  {PSY:928-161-1683}  ? ?Risk Assessment: ?Danger to Self:  {PSY:22692} ?Self-injurious Behavior: {PSY:22692} ?Danger to Others: {PSY:22692} ?Duty to Warn:{PSY:311194} ?Physical Aggression / Violence:{PSY:21197} ?Access to Firearms a concern: {PSY:21197} ?Gang Involvement:{PSY:21197} ? ?Subjective: ***  ? ?Interventions: {PSY:828-308-5969} ? ?Diagnosis:No diagnosis found. ? ?Plan: Patient's goal of treatment is to learn more about herself and to feel normal.  ?Treatment Target: Understand the relationship between thoughts, emotions, and behaviors  ?Psychoeducation on CBT model   ?Teach the connection between thoughts, emotions, and behaviors  ?Enhance emotional awareness and discrimination of emotions  ?  ?Treatment Target: Increase realistic balanced thinking -to learn how to replace thinking with thoughts that are more accurate or helpful ?Explore patient's thoughts, beliefs, automatic thoughts, assumptions  ?Identify and replace unhelpful thinking patterns (upsetting ideas, self-talk and mental images) ?Process distress and allow for emotional release  ?Questioning and challenging thoughts ?Cognitive reappraisal  ?Provided psychoeducation on core  beliefs, explore, and assist patient in identifying core beliefs  ?  ?Treatment Target: Reducing vulnerability to ?emotional mind? and increase emotional regulation ?Values clarification   ?Self-care - nutrition, sleep, exercise  ?Mindfulness practices  ?Mindfulness acceptance  ? ? ?Milton Ferguson, LCSW ? ?

## 2021-07-18 ENCOUNTER — Ambulatory Visit: Payer: Self-pay | Admitting: Licensed Clinical Social Worker

## 2021-07-18 DIAGNOSIS — F411 Generalized anxiety disorder: Secondary | ICD-10-CM

## 2021-07-18 NOTE — Progress Notes (Signed)
Counselor/Therapist Progress Note ? ?Patient ID: Audrey Perez, MRN: [id number],   ? ?Date: 07/18/2021 ? ?Time Spent: 45 minutes   ? ?Treatment Type: Psychotherapy ? ?Reported Symptoms:  Continued anxiety, anxious thoughts, panic sensations, sleep disturbance ? ?Mental Status Exam: ? ?Appearance:   Casual     ?Behavior:  Appropriate and Sharing  ?Motor:  Normal  ?Speech/Language:   Clear and Coherent and Normal Rate  ?Affect:  Appropriate, Congruent, and Full Range  ?Mood:  normal  ?Thought process:  normal  ?Thought content:    WNL  ?Sensory/Perceptual disturbances:    WNL  ?Orientation:  oriented to person, place, time/date, situation, and day of week  ?Attention:  Good  ?Concentration:  Good  ?Memory:  WNL  ?Fund of knowledge:   Good  ?Insight:    Good  ?Judgment:   Good  ?Impulse Control:  Good  ? ?Risk Assessment: ?Danger to Self:  No ?Self-injurious Behavior: No ?Danger to Others: No ?Duty to Warn:no ?Physical Aggression / Violence:No  ?Access to Firearms a concern: No  ?Gang Involvement:No  ? ?Subjective: Patient was receptive to feedback and intervention from LCSW and actively and effectively participated throughout the session. Patient voices improvement in symptoms, increased use of mindfulness meditation, self-help books, and regular exercise.  ?  ? ?Interventions: Cognitive Behavioral Therapy and Mindfulness Meditation ?Established psychological safety. Checked in with patient regarding her current symptoms and psychosocial stressors. LCSW reviewed mindfulness and acceptance stratgies with patient. Encouraged patient to continue practicing mindfulness and other self-care activities she finds benefit from. Provided support through active listening, validation of feelings, and highlighted patient's strengths.  ? ? ?Diagnosis: ?  ICD-10-CM   ?1. Generalized anxiety disorder  F41.1   ?  ? ? ?Plan: Patient's goal of treatment is to learn more about herself and to feel normal.  ?Treatment Target: Understand  the relationship between thoughts, emotions, and behaviors  ?Psychoeducation on CBT model   ?Teach the connection between thoughts, emotions, and behaviors  ?Enhance emotional awareness and discrimination of emotions  ?  ?Treatment Target: Increase realistic balanced thinking -to learn how to replace thinking with thoughts that are more accurate or helpful ?Explore patient's thoughts, beliefs, automatic thoughts, assumptions  ?Identify and replace unhelpful thinking patterns (upsetting ideas, self-talk and mental images) ?Process distress and allow for emotional release  ?Questioning and challenging thoughts ?Cognitive reappraisal  ?Provided psychoeducation on core beliefs, explore, and assist patient in identifying core beliefs  ?  ?Treatment Target: Reducing vulnerability to ?emotional mind? and increase emotional regulation ?Values clarification   ?Self-care - nutrition, sleep, exercise  ?Mindfulness practices  ?Mindfulness acceptance  ? ?No future appointments. ? ?LCSW attempted to call patient to schedule follow up appointment. A voice message was left.  ? ?Milton Ferguson, LCSW ? ?

## 2021-08-01 ENCOUNTER — Encounter: Payer: Self-pay | Admitting: Nurse Practitioner

## 2021-08-01 ENCOUNTER — Ambulatory Visit: Payer: Self-pay | Admitting: Nurse Practitioner

## 2021-08-01 DIAGNOSIS — Z113 Encounter for screening for infections with a predominantly sexual mode of transmission: Secondary | ICD-10-CM

## 2021-08-01 LAB — WET PREP FOR TRICH, YEAST, CLUE
Trichomonas Exam: NEGATIVE
Yeast Exam: NEGATIVE

## 2021-08-01 LAB — HM HIV SCREENING LAB: HM HIV Screening: NEGATIVE

## 2021-08-01 NOTE — Progress Notes (Signed)
[facility] Department ? ?STI clinic/screening visit ?Audrey Perez [place] [address] ?[phone] ? ?Subjective:  ?Audrey Perez is a 31 y.o. female being seen today for an STI screening visit. The patient reports they do have symptoms.  Patient reports that they do not desire a pregnancy in the next year.   They reported they are not interested in discussing contraception today.   ? ?Patient's last menstrual period was 07/09/2021. ? ? ?Patient has the following medical conditions:   ?Patient Active Problem List  ? Diagnosis Date Noted  ? Generalized anxiety disorder 02/23/2021  ? Marijuana use 01/12/2021  ? Anxiety dx'd 2013 12/12/2013  ? Seizure disorder ([place]) 12/19/2012  ? 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 ? ?Chief Complaint  ?Patient presents with  ? SEXUALLY TRANSMITTED DISEASE  ?  Screening  ? ? ?HPI ? ?Patient reports to clinic today for an STD screening.  Patient reports discharge and lower abdominal pain for 3 weeks.  ? ?Last HIV test per patient/review of record was 01/12/2021 ?Patient reports last pap was 01/12/2021.  ? ?Screening for MPX risk: ?Does the patient have an unexplained rash? No ?Is the patient MSM? No ?Does the patient endorse multiple sex partners or anonymous sex partners? No ?Did the patient have close or sexual contact with a person diagnosed with MPX? No ?Has the patient traveled outside the [place] where MPX is endemic? No ?Is there a high clinical suspicion for MPX-- evidenced by one of the following No ? -Unlikely to be chickenpox ? -Lymphadenopathy ? -Rash that present in same phase of evolution on any given body part ?See flowsheet for further details and programmatic requirements.  ? ?Immunization history:  ?Immunization History  ?Administered Date(s) Administered  ? DTaP 01/10/1990, 05/02/1990, 08/30/1991, 12/10/1991, 09/05/1994  ? Hepatitis B 01/18/2001, 02/15/2001, 07/26/2001  ? HiB (PRP-OMP) 01/10/1990, 05/02/1990, 08/30/1990, 09/03/1991   ? IPV 01/10/1990, 05/02/1990, 09/05/1994  ? MMR 09/03/1991, 09/05/1994  ? Td 01/13/2009  ?  ? ?The following portions of the patient's history were reviewed and updated as appropriate: allergies, current medications, past medical history, past social history, past surgical history and problem list. ? ?Objective:  ?There were no vitals filed for this visit. ? ?Physical Exam ?Constitutional:   ?   Appearance: Normal appearance.  ?HENT:  ?   Head: Normocephalic.  ?   Right Ear: External ear normal.  ?   Left Ear: External ear normal.  ?   Nose: Nose normal.  ?   Mouth/Throat:  ?   Lips: Pink.  ?   Mouth: Mucous membranes are moist.  ?   Comments: No visible signs of dental caries  ?Pulmonary:  ?   Effort: Pulmonary effort is normal.  ?Abdominal:  ?   General: Abdomen is flat.  ?   Palpations: Abdomen is soft.  ?Genitourinary: ?   Comments: Deferred, patient declines genital exam.  Desires to self collect ?Musculoskeletal:  ?   Cervical back: Full passive range of motion without pain, normal range of motion and neck supple.  ?Skin: ?   General: Skin is warm and dry.  ?Neurological:  ?   Mental Status: She is alert and oriented to person, place, and time.  ?Psychiatric:     ?   Attention and Perception: Attention normal.     ?   Mood and Affect: Mood normal.     ?   Speech: Speech normal.     ?   Behavior: Behavior is cooperative.  ? ? ? ?  Assessment and Plan:  ?Audrey Perez is a 31 y.o. female presenting to the [facility] Department for STI screening ? ?1. Screening examination for venereal disease ?-31 year old female in clinic today for STD screening. ?-Patient accepted all screenings including oral, vaginal CT/GC and bloodwork for HIV/RPR.  ?Patient meets criteria for HepB screening? Yes. Ordered? No - refused ?Patient meets criteria for HepC screening? Yes. Ordered? No - refused ? ?Treat wet prep per standing order ?Discussed time line for [facility] results and that patient will be called with  positive results and encouraged patient to call if she had not heard in 2 weeks.  ?Counseled to return or seek care for continued or worsening symptoms ?Recommended condom use with all sex ? ?Patient is not currently using  contraception  to prevent pregnancy.   ? ?- Gonococcus culture ?- WET PREP FOR TRICH, YEAST, CLUE ?- Chlamydia/Gonorrhea [place] [facility] ?- HIV [facility] ?- Syphilis Serology, [facility] ? ? ? ? ?Return if symptoms worsen or fail to improve. ? ?Future Appointments  ?Date Time Provider [place]  ?08/08/2021 11:00 AM Milton Ferguson, LCSW [facility] None  ? ? ?Gregary Cromer, FNP ?

## 2021-08-01 NOTE — Progress Notes (Signed)
Patient here for STD testing.Wet prep reviewed. No tx per standing orders. Condoms declined.  

## 2021-08-06 LAB — GONOCOCCUS CULTURE

## 2021-08-08 ENCOUNTER — Ambulatory Visit: Payer: Self-pay | Admitting: Licensed Clinical Social Worker

## 2021-08-08 NOTE — Progress Notes (Unsigned)
Counselor/Therapist Progress Note ? ?Patient ID: Audrey Perez, MRN: [id number],   ? ?Date: 08/08/2021 ? ?Time Spent: ***  ? ?Treatment Type: {CHL AMB THERAPY TYPES:[id number]} ? ?Reported Symptoms: {CHL AMB Reported [phone]} ? ?Mental Status Exam: ? ?Appearance:   {PSY:22683}     ?Behavior:  {PSY:21022743}  ?Motor:  {PSY:22302}  ?Speech/Language:   {PSY:22685}  ?Affect:  {PSY:22687}  ?Mood:  {PSY:31886}  ?Thought process:  {PSY:31888}  ?Thought content:    {PSY:780-034-2839}  ?Sensory/Perceptual disturbances:    {PSY:415-455-9365}  ?Orientation:  {PSY:30297}  ?Attention:  {PSY:22877}  ?Concentration:  {PSY:872-644-8939}  ?Memory:  {PSY:939-331-3187}  ?Fund of knowledge:   {PSY:872-644-8939}  ?Insight:    {PSY:872-644-8939}  ?Judgment:   {PSY:872-644-8939}  ?Impulse Control:  {PSY:872-644-8939}  ? ?Risk Assessment: ?Danger to Self:  {PSY:22692} ?Self-injurious Behavior: {PSY:22692} ?Danger to Others: {PSY:22692} ?Duty to Warn:{PSY:311194} ?Physical Aggression / Violence:{PSY:21197} ?Access to Firearms a concern: {PSY:21197} ?Gang Involvement:{PSY:21197} ? ?Subjective: ***  ? ?Interventions: {PSY:713-573-1582} ? ?Diagnosis: ?  ICD-10-CM   ?1. Generalized anxiety disorder  F41.1   ?  ? ?Plan: Patient's goal of treatment is to learn more about herself and to feel normal.  ?Treatment Target: Understand the relationship between thoughts, emotions, and behaviors  ?Psychoeducation on CBT model   ?Teach the connection between thoughts, emotions, and behaviors  ?Enhance emotional awareness and discrimination of emotions  ?  ?Treatment Target: Increase realistic balanced thinking -to learn how to replace thinking with thoughts that are more accurate or helpful ?Explore patient's thoughts, beliefs, automatic thoughts, assumptions  ?Identify and replace unhelpful thinking patterns (upsetting ideas, self-talk and mental images) ?Process distress and allow for emotional release  ?Questioning and challenging thoughts ?Cognitive  reappraisal  ?Provided psychoeducation on core beliefs, explore, and assist patient in identifying core beliefs  ?  ?Treatment Target: Reducing vulnerability to ?emotional mind? and increase emotional regulation ?Values clarification   ?Self-care - nutrition, sleep, exercise  ?Mindfulness practices  ?Mindfulness acceptance  ? ?Future Appointments  ?Date Time Provider [place]  ?08/08/2021 11:00 AM Milton Ferguson, LCSW [facility] None  ? ? ?Milton Ferguson, LCSW ? ?

## 2021-08-30 ENCOUNTER — Ambulatory Visit: Payer: Self-pay | Admitting: Licensed Clinical Social Worker

## 2021-08-30 DIAGNOSIS — F411 Generalized anxiety disorder: Secondary | ICD-10-CM

## 2021-08-30 NOTE — Progress Notes (Signed)
Counselor/Therapist Progress Note  Patient ID: Audrey Perez, MRN: [id number],    Date: 08/30/2021  Time Spent: 50 minutes    Treatment Type: Psychotherapy  Reported Symptoms:  Anxiety, anxious thoughts and worries  Mental Status Exam:  Appearance:   Casual, Neat, and Well Groomed     Behavior:  Appropriate and Sharing  Motor:  Normal  Speech/Language:   Clear and Coherent and Normal Rate  Affect:  Appropriate, Congruent, Full Range, and Tearful  Mood:  sad and tearful at times  Thought process:  normal  Thought content:    WNL  Sensory/Perceptual disturbances:    WNL  Orientation:  oriented to person, place, time/date, and situation  Attention:  Good  Concentration:  Good  Memory:  WNL  Fund of knowledge:   Good  Insight:    Good  Judgment:   Good  Impulse Control:  Good   Risk Assessment: Danger to Self:  No Self-injurious Behavior: No Danger to Others: No Duty to Warn:no Physical Aggression / Violence:No  Access to Firearms a concern: No  Gang Involvement:No   Subjective: Patient was engaged and cooperative throughout the session using time effectively to discuss thoughts and emotions. Patient was receptive to feedback and intervention from LCSW. Patient is likely to benefit from future treatment because they remain motivated to decrease anxiety and reports benefit of regular sessions.      Interventions: Cognitive Behavioral Therapy Established psychological safety. Checked in with patient regarding their week. LCSW assisted patient in processing their thoughts and emotions about current psychosocial stressors, increased anxiety, feeling unwell, challenges in making career choices. Validated patient's feelings of distress. LCSW reviewed mindfulness strategies, reframing thoughts, and noticing self-talk. Provided support through active listening, validation of feelings, and highlighted patient's strengths.    Diagnosis:   ICD-10-CM   1. Generalized anxiety disorder   F41.1      Plan: Patient's goal of treatment is to learn more about herself and to feel normal.  Treatment Target: Understand the relationship between thoughts, emotions, and behaviors  Psychoeducation on CBT model   Teach the connection between thoughts, emotions, and behaviors  Enhance emotional awareness and discrimination of emotions    Treatment Target: Increase realistic balanced thinking -to learn how to replace thinking with thoughts that are more accurate or helpful Explore patient's thoughts, beliefs, automatic thoughts, assumptions  Identify and replace unhelpful thinking patterns (upsetting ideas, self-talk and mental images) Process distress and allow for emotional release  Questioning and challenging thoughts Cognitive reappraisal  Provided psychoeducation on core beliefs, explore, and assist patient in identifying core beliefs    Treatment Target: Reducing vulnerability to "emotional mind" and increase emotional regulation Values clarification   Self-care - nutrition, sleep, exercise  Mindfulness practices  Mindfulness acceptance   No future appointments. Patient to contact LCSW regarding scheduling next appointment, per pt. Request.   Kathreen Cosier, LCSW

## 2021-09-05 ENCOUNTER — Ambulatory Visit: Payer: Self-pay | Admitting: Licensed Clinical Social Worker

## 2021-09-05 DIAGNOSIS — F411 Generalized anxiety disorder: Secondary | ICD-10-CM

## 2021-09-05 NOTE — Progress Notes (Signed)
Counselor/Therapist Progress Note  Patient ID: Audrey Perez, MRN: [id number],    Date: 09/05/2021  Time Spent: 48 minutes   Treatment Type: Psychotherapy  Reported Symptoms: Panic attacks, Obsessive thinking, Appetite disturbance, and Anxiety, anxious thoughts; panic episodes  Mental Status Exam:  Appearance:   NA     Behavior:  Appropriate and Sharing  Motor:  NA  Speech/Language:   Clear and Coherent and Normal Rate  Affect:  NA  Mood:  normal  Thought process:  normal  Thought content:    WNL  Sensory/Perceptual disturbances:    WNL  Orientation:  oriented to person, place, time/date, and situation  Attention:  Good  Concentration:  Good  Memory:  WNL  Fund of knowledge:   Good  Insight:    Good  Judgment:   Good  Impulse Control:  Good   Risk Assessment: Danger to Self:  No Self-injurious Behavior: No Danger to Others: No Duty to Warn:no Physical Aggression / Violence:No  Access to Firearms a concern: No  Gang Involvement:No   Subjective: Patient was engaged and cooperative throughout the session using time effectively to discuss thoughts and feelings. Patient voices continued motivation for treatment and understanding of anxiety issues. Patient is likely to benefit from future treatment because  remains motivated to decrease panic and anxiety and reports benefit of regular sessions in addressing these symptoms.   Interventions: Cognitive Behavioral Therapy Established psychological safety. Checked in with client and explored current anxiety concerns. LCSW taught patient about 4, 7, 8 breathing, cold/ice technique, mindfulness and acceptance of the experiences, reframing and challenging unhelpful thoughts.  Provided support through active listening, validation of feelings, and highlighted patient's strengths.   Diagnosis:   ICD-10-CM   1. Generalized anxiety disorder  F41.1      Plan: Patient's goal of treatment is to learn more about herself and to feel  normal.  Treatment Target: Understand the relationship between thoughts, emotions, and behaviors  Psychoeducation on CBT model   Teach the connection between thoughts, emotions, and behaviors  Enhance emotional awareness and discrimination of emotions    Treatment Target: Increase realistic balanced thinking -to learn how to replace thinking with thoughts that are more accurate or helpful Explore patient's thoughts, beliefs, automatic thoughts, assumptions  Identify and replace unhelpful thinking patterns (upsetting ideas, self-talk and mental images) Process distress and allow for emotional release  Questioning and challenging thoughts Cognitive reappraisal  Provided psychoeducation on core beliefs, explore, and assist patient in identifying core beliefs    Treatment Target: Reducing vulnerability to "emotional mind" and increase emotional regulation Values clarification   Self-care - nutrition, sleep, exercise  Mindfulness practices  Mindfulness acceptance   Future Appointments  Date Time Provider Department Center  09/15/2021  1:00 PM Kathreen Cosier, LCSW [facility] None    Kathreen Cosier, LCSW

## 2021-09-15 ENCOUNTER — Ambulatory Visit: Payer: Self-pay | Admitting: Licensed Clinical Social Worker

## 2022-11-06 ENCOUNTER — Ambulatory Visit: Payer: Managed Care, Other (non HMO)

## 2023-01-12 ENCOUNTER — Ambulatory Visit: Payer: Self-pay | Admitting: Family Medicine

## 2023-01-12 VITALS — BP 123/77 | HR 66 | Ht 65.0 in | Wt 140.0 lb

## 2023-01-12 DIAGNOSIS — L739 Follicular disorder, unspecified: Secondary | ICD-10-CM

## 2023-01-12 DIAGNOSIS — Z113 Encounter for screening for infections with a predominantly sexual mode of transmission: Secondary | ICD-10-CM

## 2023-01-12 DIAGNOSIS — B9689 Other specified bacterial agents as the cause of diseases classified elsewhere: Secondary | ICD-10-CM

## 2023-01-12 LAB — WET PREP FOR TRICH, YEAST, CLUE
Trichomonas Exam: NEGATIVE
Yeast Exam: NEGATIVE

## 2023-01-12 MED ORDER — CLINDAMYCIN PHOSPHATE 1 % EX SOLN: Freq: Two times a day (BID) | CUTANEOUS | 0 refills | Status: AC

## 2023-01-12 MED ORDER — METRONIDAZOLE 500 MG PO TABS: 500.0000 mg | ORAL_TABLET | Freq: Two times a day (BID) | ORAL | Status: AC

## 2023-01-12 NOTE — Progress Notes (Signed)
[facility]  Subjective:  Audrey Perez is a 32 y.o. being seen today for   Chief Complaint  Patient presents with   SEXUALLY TRANSMITTED DISEASE    HPI  Reports to clinic for STI testing. States she has chronic BV and has "bumps" on her bottom that have been there for some time. Chart review shows patient was seen in March at [facility] for well woman visit and was told this was folliculitis   Health Maintenance Due  Topic Date Due   DTaP/Tdap/Td (6 - Tdap) 01/14/2019   INFLUENZA VACCINE  Never done   COVID-19 Vaccine (3 - 2023-24 season) 11/26/2022    ROS  The following portions of the patient's history were reviewed and updated as appropriate: allergies, current medications, past family history, past medical history, past social history, past surgical history and problem list. Problem list updated.   See flowsheet for other program required questions.  Objective:   Vitals:   01/12/23 1400  BP: 123/77  Pulse: 66  Weight: 140 lb (63.5 kg)  Height: 5\' 5"  (1.651 m)    Physical Exam Vitals and nursing note reviewed. Chaperone present: pt educated on chaperones- declined to have chaperone in the room.  Constitutional:      Appearance: Normal appearance.  HENT:     Head: Normocephalic and atraumatic.     Mouth/Throat:     Mouth: Mucous membranes are moist.     Pharynx: Oropharynx is clear. No oropharyngeal exudate or posterior oropharyngeal erythema.  Pulmonary:     Effort: Pulmonary effort is normal.  Abdominal:     General: Abdomen is flat.     Palpations: There is no mass.     Tenderness: There is no abdominal tenderness. There is no rebound.  Genitourinary:    General: Normal vulva.     Exam position: Lithotomy position.     Pubic Area: No rash or pubic lice.      Labia:        Right: No rash or lesion.        Left: No rash or lesion.      Vagina: Vaginal discharge present. No erythema,  bleeding or lesions.     Cervix: No cervical motion tenderness, discharge, friability, lesion or erythema.     Uterus: Normal.      Adnexa: Right adnexa normal and left adnexa normal.     Rectum: Normal.       Comments: pH = 5  Multiple pustules covering gluteal surface Lymphadenopathy:     Head:     Right side of head: No preauricular or posterior auricular adenopathy.     Left side of head: No preauricular or posterior auricular adenopathy.     Cervical: No cervical adenopathy.     Upper Body:     Right upper body: No supraclavicular, axillary or epitrochlear adenopathy.     Left upper body: No supraclavicular, axillary or epitrochlear adenopathy.     Lower Body: No right inguinal adenopathy. No left inguinal adenopathy.  Skin:    General: Skin is warm and dry.     Findings: No rash.  Neurological:     Mental Status: She is alert and oriented to person, place, and time.       Assessment and Plan:  Audrey Perez is a 32 y.o. female presenting [facility] for a Women's Health problem visit  1. Screening for venereal disease  -  Chlamydia/Gonorrhea [facility] - HIV [place] [facility] - Syphilis Serology, [place] [facility] - WET PREP FOR TRICH, YEAST, CLUE  2. Folliculitis -reviewed previous hx in chart- appears she was seen at [facility] in March for similar issue -pt reports that at times these pustules seem very deep, painful and have left scars- one area of scarring noted on right labia -reviewed that it sounds like possible HS- however the majority of her lesions do not fit the typical presentation -opted to try clindamycin wash to start -counseled she can also try OTC products: dandruff shampoo, Hibiclens or Dial soap  - clindamycin (CLEOCIN T) 1 % external solution; Apply topically 2 (two) times daily. Use 1-2 times a day on affected areas  Dispense: 30 mL; Refill: 0  3. Bacterial vaginosis -reviewed ways to prevent BV including boric acid, wearing  cotton underwear, changing laundry detergent or soap  - metroNIDAZOLE (FLAGYL) 500 MG tablet; Take 1 tablet (500 mg total) by mouth 2 (two) times daily for 7 days.     Return in about 1 year (around 01/12/2024), or if symptoms worsen or fail to improve, for STI screening.  No future appointments. Total time spent 45 minutes   Lenice Llamas, [place]

## 2023-01-12 NOTE — Progress Notes (Signed)
Pt is here for acute visit, condoms declined. Wet prep reviewed and treated per standing order. The patient was dispensed metronidazole today. I provided counseling today regarding the medication. We discussed the medication, the side effects and when to call clinic. Patient given the opportunity to ask questions. Questions answered.  Gaspar Garbe, RN

## 2023-01-24 ENCOUNTER — Telehealth: Payer: Self-pay | Admitting: Family Medicine

## 2023-01-24 DIAGNOSIS — L739 Follicular disorder, unspecified: Secondary | ICD-10-CM

## 2023-01-24 NOTE — Telephone Encounter (Signed)
Please give me a call back the meds you gave I feel like I need more of the topical you gave just did not work as well as the pill I still have a knot that is painful and more med I feel would help me more

## 2023-01-25 MED ORDER — METRONIDAZOLE 500 MG PO TABS: 500.0000 mg | ORAL_TABLET | Freq: Two times a day (BID) | ORAL | 0 refills | Status: AC

## 2023-01-25 NOTE — Telephone Encounter (Signed)
Called patient back reporting that the spots on her bottom are not better and that it got worse with clindamycin. She reports that she feels that the metronidazole really helps the folliculitis. I reviewed that I am not sure this would help with this, at the metro was prescribed due to BV. Pt strongly desires to try one more week of metro. I agreed to send in a prescription, but counseled that if this does not improve it- she needs to return to PCP or derm as I do not manage chronic skin concerns. Verbalized understanding    [facility] FNP-C

## 2023-03-23 NOTE — Addendum Note (Signed)
Addended by: Lenice Llamas on: 03/23/2023 04:49 PM   Modules accepted: Orders

## 2023-09-13 ENCOUNTER — Other Ambulatory Visit: Payer: Self-pay | Admitting: Family Medicine

## 2023-09-13 DIAGNOSIS — L739 Follicular disorder, unspecified: Secondary | ICD-10-CM
